# Patient Record
Sex: Male | Born: 1980 | Race: Asian | Hispanic: No | Marital: Single | Smoking: Former smoker
Health system: Southern US, Community
[De-identification: ages and names within clinical notes are randomized; demographics above are authoritative.]

## PROBLEM LIST (undated history)

## (undated) DIAGNOSIS — F32A Depression, unspecified: Secondary | ICD-10-CM

## (undated) DIAGNOSIS — F329 Major depressive disorder, single episode, unspecified: Secondary | ICD-10-CM

---

## 2010-05-11 ENCOUNTER — Emergency Department (HOSPITAL_BASED_OUTPATIENT_CLINIC_OR_DEPARTMENT_OTHER): Admission: EM | Admit: 2010-05-11 | Discharge: 2010-05-12 | Payer: Self-pay | Admitting: Emergency Medicine

## 2010-05-12 ENCOUNTER — Emergency Department (HOSPITAL_BASED_OUTPATIENT_CLINIC_OR_DEPARTMENT_OTHER): Admission: EM | Admit: 2010-05-12 | Discharge: 2010-05-12 | Payer: Self-pay | Admitting: Emergency Medicine

## 2010-12-03 LAB — URINALYSIS, ROUTINE W REFLEX MICROSCOPIC
Bilirubin Urine: NEGATIVE
Hgb urine dipstick: NEGATIVE
Nitrite: NEGATIVE
Protein, ur: 100 mg/dL — AB
Urobilinogen, UA: 0.2 mg/dL (ref 0.0–1.0)

## 2010-12-03 LAB — COMPREHENSIVE METABOLIC PANEL
CO2: 23 mEq/L (ref 19–32)
Calcium: 9.3 mg/dL (ref 8.4–10.5)
Creatinine, Ser: 1.2 mg/dL (ref 0.4–1.5)
GFR calc non Af Amer: >60 mL/min (ref >60)
Glucose, Bld: 124 mg/dL — ABNORMAL HIGH (ref 70–99)

## 2010-12-03 LAB — DIFFERENTIAL
Eosinophils Relative: 0 % (ref 0–5)
Lymphocytes Relative: 12 % (ref 12–46)
Monocytes Relative: 4 % (ref 3–12)
Neutrophils Relative %: 84 % — ABNORMAL HIGH (ref 43–77)

## 2010-12-03 LAB — CBC
HCT: 44.7 % (ref 39.0–52.0)
Hemoglobin: 15.4 g/dL (ref 13.0–17.0)
MCH: 30.1 pg (ref 26.0–34.0)
MCHC: 34.5 g/dL (ref 30.0–36.0)

## 2010-12-03 LAB — LACTIC ACID, PLASMA: Lactic Acid, Venous: 0.9 mmol/L (ref 0.5–2.2)

## 2010-12-03 LAB — HEMOCCULT GUIAC POC 1CARD (OFFICE): Fecal Occult Bld: POSITIVE

## 2010-12-03 LAB — URINE MICROSCOPIC-ADD ON

## 2013-11-24 ENCOUNTER — Encounter (HOSPITAL_BASED_OUTPATIENT_CLINIC_OR_DEPARTMENT_OTHER): Payer: Self-pay | Admitting: Emergency Medicine

## 2013-11-24 ENCOUNTER — Emergency Department (HOSPITAL_BASED_OUTPATIENT_CLINIC_OR_DEPARTMENT_OTHER)
Admission: EM | Admit: 2013-11-24 | Discharge: 2013-11-24 | Disposition: A | Discharge status: Home / Self Care | Payer: 59 | Attending: Emergency Medicine | Admitting: Emergency Medicine

## 2013-11-24 DIAGNOSIS — Z79899 Other long term (current) drug therapy: Secondary | ICD-10-CM | POA: Insufficient documentation

## 2013-11-24 DIAGNOSIS — L0231 Cutaneous abscess of buttock: Secondary | ICD-10-CM | POA: Insufficient documentation

## 2013-11-24 DIAGNOSIS — F3289 Other specified depressive episodes: Secondary | ICD-10-CM | POA: Insufficient documentation

## 2013-11-24 DIAGNOSIS — L03317 Cellulitis of buttock: Principal | ICD-10-CM

## 2013-11-24 DIAGNOSIS — F329 Major depressive disorder, single episode, unspecified: Secondary | ICD-10-CM | POA: Insufficient documentation

## 2013-11-24 DIAGNOSIS — Z87891 Personal history of nicotine dependence: Secondary | ICD-10-CM | POA: Insufficient documentation

## 2013-11-24 HISTORY — DX: Major depressive disorder, single episode, unspecified: F32.9

## 2013-11-24 HISTORY — DX: Depression, unspecified: F32.A

## 2013-11-24 MED ORDER — HYDROCODONE-ACETAMINOPHEN 5-325 MG PO TABS: 1.0000 | ORAL_TABLET | Freq: Four times a day (QID) | ORAL | Status: AC | PRN

## 2013-11-24 MED ORDER — SULFAMETHOXAZOLE-TRIMETHOPRIM 800-160 MG PO TABS: 1.0000 | ORAL_TABLET | Freq: Two times a day (BID) | ORAL | Status: AC

## 2013-11-24 NOTE — ED Notes (Signed)
Rx x 2 given for norco and septra

## 2013-11-24 NOTE — ED Notes (Signed)
Patient has swollen skin infection on R upper buttock, warm to touch/hard

## 2013-11-24 NOTE — ED Provider Notes (Signed)
Medical screening examination/treatment/procedure(s) were performed by non-physician practitioner and as supervising physician I was immediately available for consultation/collaboration.   EKG Interpretation None        Shelda JakesScott W. Xan Sparkman, MD 11/24/13 2355

## 2013-11-24 NOTE — ED Provider Notes (Signed)
CSN: [id number]     Arrival date & time 11/24/13  1823 History   First MD Initiated Contact with Patient 11/24/13 2000     Chief Complaint  Patient presents with  . Abscess     (Consider location/radiation/quality/duration/timing/severity/associated sxs/prior Treatment) Patient is a 33 y.o. male presenting with abscess. The history is provided by the patient.  Abscess Location:  Ano-genital Ano-genital abscess location:  R buttock Size:  3 cm x 3 cm Abscess quality: draining, painful, redness and warmth   Red streaking: yes   Duration:  2 days Progression:  Worsening Pain details:    Quality:  Throbbing and shooting   Severity:  Moderate   Timing:  Constant   Progression:  Worsening Chronicity:  New Context: insect bite/sting   Context: not diabetes   Relieved by:  Nothing Worsened by:  Draining/squeezing Ineffective treatments:  Draining/squeezing  Estus Krakowski is a 33 y.o. male who presents to the ED with an abscess to the right upper buttock that started 2 days ago. He tried to squeeze the area and did get some pus out but then he noted increased pain and redness around the area. He thinks it started out as an insect bite. He denies nausea, vomiting, fever, chills or any other problems. He took Advil and that helped the pain a little bit for a while.   Past Medical History  Diagnosis Date  . Depression    History reviewed. No pertinent past surgical history. No family history on file. History  Substance Use Topics  . Smoking status: Former Games developer  . Smokeless tobacco: Not on file  . Alcohol Use: Yes    Review of Systems    Allergies  Review of patient's allergies indicates no known allergies.  Home Medications   Current Outpatient Rx  Name  Route  Sig  Dispense  Refill  . ALPRAZOLAM PO   Oral   Take by mouth as needed.         . SERTRALINE HCL PO   Oral   Take by mouth.          BP 126/95  Pulse 91  Temp(Src) 98.4 F (36.9 C) (Oral)  Resp  18  SpO2 99% Physical Exam  Nursing note and vitals reviewed. Constitutional: He is oriented to person, place, and time. He appears well-developed and well-nourished. No distress.  HENT:  Head: Normocephalic.  Eyes: EOM are normal.  Neck: Neck supple.  Cardiovascular: Normal rate.   Pulmonary/Chest: Effort normal.  Musculoskeletal: Normal range of motion.       Back:  There is a 3 cm abscess that is firm and has a small area of drainage. There is erythema surrounding the the firm area. It is tender on exam.  Neurological: He is alert and oriented to person, place, and time. No cranial nerve deficit.  Skin: Skin is warm and dry.  Psychiatric: He has a normal mood and affect. His behavior is normal.    ED Course  Procedures  MDM  32 y.o. male with abscess to the upper aspect of the right buttock that has started to drain. There is erythema surrounding the abscess. Will start antibiotics and pain medication. He will apply warm wet compresses to the area several times a day and follow up with his PCP. He will return here for worsening symptoms.  Discussed with the patient and all questioned fully answered.    Medication List    TAKE these medications       HYDROcodone-acetaminophen  5-325 MG per tablet  Commonly known as:  NORCO  Take 1 tablet by mouth every 6 (six) hours as needed for moderate pain.     sulfamethoxazole-trimethoprim 800-160 MG per tablet  Commonly known as:  BACTRIM DS,SEPTRA DS  Take 1 tablet by mouth 2 (two) times daily.      ASK your doctor about these medications       ALPRAZOLAM PO  Take by mouth as needed.     SERTRALINE HCL PO  Take by mouth.         [place]aski[place] M Neese, [place] 11/24/13 2151

## 2013-11-24 NOTE — Discharge Instructions (Signed)
Continue to take Advil. Apply warm wet compresses to the area several times a day. Take the medication as directed. Do not take the narcotic if you are driving as it will make you sleepy. Follow up with your doctor in a couple days to be sure the area is improving. Return here as needed for worsening symptoms.

## 2017-12-21 ENCOUNTER — Emergency Department (HOSPITAL_BASED_OUTPATIENT_CLINIC_OR_DEPARTMENT_OTHER)
Admission: EM | Admit: 2017-12-21 | Discharge: 2017-12-21 | Disposition: A | Discharge status: Home / Self Care | Payer: Managed Care, Other (non HMO) | Attending: Emergency Medicine | Admitting: Emergency Medicine

## 2017-12-21 ENCOUNTER — Other Ambulatory Visit: Payer: Self-pay

## 2017-12-21 ENCOUNTER — Encounter (HOSPITAL_BASED_OUTPATIENT_CLINIC_OR_DEPARTMENT_OTHER): Payer: Self-pay | Admitting: *Deleted

## 2017-12-21 DIAGNOSIS — S098XXA Other specified injuries of head, initial encounter: Secondary | ICD-10-CM | POA: Diagnosis present

## 2017-12-21 DIAGNOSIS — Y9389 Activity, other specified: Secondary | ICD-10-CM | POA: Diagnosis not present

## 2017-12-21 DIAGNOSIS — Z87891 Personal history of nicotine dependence: Secondary | ICD-10-CM | POA: Diagnosis not present

## 2017-12-21 DIAGNOSIS — Y999 Unspecified external cause status: Secondary | ICD-10-CM | POA: Insufficient documentation

## 2017-12-21 DIAGNOSIS — Y929 Unspecified place or not applicable: Secondary | ICD-10-CM | POA: Diagnosis not present

## 2017-12-21 DIAGNOSIS — S0101XA Laceration without foreign body of scalp, initial encounter: Secondary | ICD-10-CM | POA: Insufficient documentation

## 2017-12-21 DIAGNOSIS — W228XXA Striking against or struck by other objects, initial encounter: Secondary | ICD-10-CM | POA: Insufficient documentation

## 2017-12-21 MED ORDER — LIDOCAINE-EPINEPHRINE (PF) 2 %-1:200000 IJ SOLN
10.0000 mL | Freq: Once | INTRAMUSCULAR | Status: AC
  Administered 2017-12-21: 10 mL
  Filled 2017-12-21: qty 10

## 2017-12-21 NOTE — Discharge Instructions (Addendum)
You were seen in the emergency department today for a laceration to your scalp. This was repaired with 4 staples. These staples will need to be removed in 1 week, this may be performed at your primary care follow up appointment next Friday, in the ER or at an urgent care. Return to the ER sooner for any new or worsening symptoms including but not limited to fever, chills, redness around the wound, discharge from the wound, passing out, numbness, weakness, change in your vision, or any other concerns.

## 2017-12-21 NOTE — ED Provider Notes (Signed)
[facility] EMERGENCY DEPARTMENT Provider Note   CSN: [id number] Arrival date & time: 12/21/17  1927     History   Chief Complaint Chief Complaint  Patient presents with  . Laceration    HPI Paul Glover is a 37 y.o. male who presents to the ED s/p head injury shortly prior to arrival with scalp laceration. Patient states he was working on his car when the hood of the car came down about 1-2 feet and hit him in the head. No LOC. States he is having minimal discomfort to the area of the laceration, states this is a 1/10 in severity. No alleviating/aggravting factors. Last tetanus was 1-2 years prior. Denies nausea, vomiting, change in vision, numbness, weakness, rhinorrhea, seizure activity, confusion, or neck pain. Patient is not on any blood thinners.   HPI  Past Medical History:  Diagnosis Date  . Depression     There are no active problems to display for this patient.   History reviewed. No pertinent surgical history.      Home Medications    Prior to Admission medications   Medication Sig Start Date End Date Taking? Authorizing Provider  SERTRALINE HCL PO Take by mouth.   Yes [provider]  ALPRAZOLAM PO Take by mouth as needed.    [provider]  HYDROcodone-acetaminophen (NORCO) 5-325 MG per tablet Take 1 tablet by mouth every 6 (six) hours as needed for moderate pain. 11/24/13   Janne NapoleonNeese, Hope M, NP    Family History History reviewed. No pertinent family history.  Social History Social History   Tobacco Use  . Smoking status: Former Games developermoker  . Smokeless tobacco: Never Used  Substance Use Topics  . Alcohol use: Yes  . Drug use: No     Allergies   Patient has no known allergies.   Review of Systems Review of Systems  Eyes: Negative for visual disturbance.  Musculoskeletal: Negative for neck pain.  Skin: Positive for wound.  Neurological: Negative for dizziness, seizures, syncope, weakness, light-headedness and numbness.    Psychiatric/Behavioral: Negative for confusion.   Physical Exam Updated Vital Signs BP (!) 134/106 (BP Location: Left Arm)   Pulse 83   Temp 98.2 F (36.8 C) (Oral)   Resp 20   Ht 5\' 9"  (1.753 m)   Wt 72.6 kg (160 lb)   SpO2 99%   BMI 23.63 kg/m   Physical Exam  Constitutional: He appears well-developed and well-nourished.  Non-toxic appearance. No distress.  HENT:  Head: Normocephalic. Head is with laceration (2.5 cm linear frontal scalp laceration. No foreign body or active bleeding.  There is an additional 0.5 very superficial laceration just posterior to the larger one on the scalp. ). Head is without raccoon's eyes and without Battle's sign.  Right Ear: No hemotympanum.  Left Ear: No hemotympanum.  Nose: No rhinorrhea.  Eyes: Pupils are equal, round, and reactive to light. Conjunctivae and EOM are normal. Right eye exhibits no discharge. Left eye exhibits no discharge.  Neck: Normal range of motion. Neck supple. No spinous process tenderness present.  Cardiovascular: Normal rate and regular rhythm.  No murmur heard. Pulmonary/Chest: Breath sounds normal. No respiratory distress. He has no wheezes. He has no rales.  Neurological: He is alert.  Clear speech. CN III-XII grossly intact. Sensation grossly intact to bilateral upper/lower extremities. 5/5 grip strength. 5/5 strength with plantar/dorsiflexion. Steady gait.   Skin: Skin is warm and dry. No rash noted.  Psychiatric: He has a normal mood and affect. His  behavior is normal.  Nursing note and vitals reviewed.    ED Treatments / Results  Labs (all labs ordered are listed, but only abnormal results are displayed) Labs Reviewed - No data to display  EKG None  Radiology No results found.  Procedures .Marland KitchenLaceration Repair Date/Time: 12/21/2017 8:41 PM Performed by: Cherly Anderson, PA-C Authorized by: Cherly Anderson, PA-C   Consent:    Consent obtained:  Verbal   Consent given by:  Patient    Risks discussed:  Infection, pain, poor cosmetic result and poor wound healing   Alternatives discussed:  No treatment Anesthesia (see MAR for exact dosages):    Anesthesia method:  Local infiltration   Local anesthetic:  Lidocaine 2% WITH epi Laceration details:    Location:  Scalp   Scalp location:  Frontal   Length (cm):  2.5   Depth (mm):  3 Exploration:    Hemostasis achieved with:  Direct pressure and epinephrine   Wound exploration: wound explored through full range of motion and entire depth of wound probed and visualized     Contaminated: no   Treatment:    Area cleansed with:  Betadine   Amount of cleaning:  Standard   Irrigation solution:  Sterile saline   Irrigation volume:  500cc   Irrigation method:  Pressure wash Skin repair:    Repair method:  Staples   Number of staples:  4 Approximation:    Approximation:  Close Post-procedure details:    Dressing:  Antibiotic ointment   Patient tolerance of procedure:  Tolerated well, no immediate complications   (including critical care time)  Medications Ordered in ED Medications  lidocaine-EPINEPHrine (XYLOCAINE W/EPI) 2 %-1:200000 (PF) injection 10 mL (10 mLs Infiltration Given by Other 12/21/17 1956)    Initial Impression / Assessment and Plan / ED Course  I have reviewed the triage vital signs and the nursing notes.  Pertinent labs & imaging results that were available during my care of the patient were reviewed by me and considered in my medical decision making (see chart for details).   Patient presents with head injury and subsequent scalp laceration. [place] head CT rule suggests no further imaging required. No focal neurologic deficits. No midline C-spine tenderness. Laceration repair with staples per procedure note above. Tdap is up to date. Patient is without comorbidities to effect normal wound healing. Patient discharged without antibiotics. I discussed need for wound recheck and staple removal in 7-8 days (  patient had PCP annual physical scheduled for next Friday), and return precautions with the patient. Provided opportunity for questions, patient confirmed understanding and is in agreement with plan.   Final Clinical Impressions(s) / ED Diagnoses   Final diagnoses:  Laceration of scalp, initial encounter    ED Discharge Orders    None       Cherly Anderson, PA-C 12/21/17 2341    Gwyneth Sprout, MD 12/23/17 0830

## 2017-12-21 NOTE — ED Triage Notes (Signed)
Scalp laceration. He was working on his car and the hood fell and hit him in the top of his head. Bleeding controlled.

## 2020-06-30 ENCOUNTER — Emergency Department (HOSPITAL_COMMUNITY)
Admission: EM | Admit: 2020-06-30 | Discharge: 2020-06-30 | Disposition: A | Discharge status: Home / Self Care | Payer: Managed Care, Other (non HMO) | Attending: Emergency Medicine | Admitting: Emergency Medicine

## 2020-06-30 ENCOUNTER — Emergency Department (HOSPITAL_COMMUNITY): Payer: Managed Care, Other (non HMO)

## 2020-06-30 ENCOUNTER — Encounter (HOSPITAL_COMMUNITY): Payer: Self-pay | Admitting: Emergency Medicine

## 2020-06-30 DIAGNOSIS — R079 Chest pain, unspecified: Secondary | ICD-10-CM | POA: Insufficient documentation

## 2020-06-30 DIAGNOSIS — Z5321 Procedure and treatment not carried out due to patient leaving prior to being seen by health care provider: Secondary | ICD-10-CM | POA: Insufficient documentation

## 2020-06-30 NOTE — ED Triage Notes (Signed)
Pt arriving POV with left side chest pain x2 weeks. Pt reports recently being put on Lipitor for high cholesterol but says he had a bad reaction to it and has been discussing alternatives with his primary provider.

## 2020-06-30 NOTE — ED Notes (Signed)
Unable to recheck patient vital signs he left and gave his stickers to the screeners

## 2020-11-17 ENCOUNTER — Emergency Department (HOSPITAL_COMMUNITY)
Admission: EM | Admit: 2020-11-17 | Discharge: 2020-11-17 | Disposition: A | Discharge status: Home / Self Care | Payer: No Typology Code available for payment source | Attending: Emergency Medicine | Admitting: Emergency Medicine

## 2020-11-17 ENCOUNTER — Encounter (HOSPITAL_COMMUNITY): Payer: Self-pay | Admitting: Emergency Medicine

## 2020-11-17 ENCOUNTER — Emergency Department (HOSPITAL_COMMUNITY): Payer: No Typology Code available for payment source

## 2020-11-17 DIAGNOSIS — Z87891 Personal history of nicotine dependence: Secondary | ICD-10-CM | POA: Diagnosis not present

## 2020-11-17 DIAGNOSIS — S9032XA Contusion of left foot, initial encounter: Secondary | ICD-10-CM

## 2020-11-17 DIAGNOSIS — S91312A Laceration without foreign body, left foot, initial encounter: Secondary | ICD-10-CM | POA: Diagnosis not present

## 2020-11-17 DIAGNOSIS — T1490XA Injury, unspecified, initial encounter: Secondary | ICD-10-CM

## 2020-11-17 DIAGNOSIS — W230XXA Caught, crushed, jammed, or pinched between moving objects, initial encounter: Secondary | ICD-10-CM | POA: Diagnosis not present

## 2020-11-17 DIAGNOSIS — S99922A Unspecified injury of left foot, initial encounter: Secondary | ICD-10-CM | POA: Diagnosis present

## 2020-11-17 DIAGNOSIS — Y990 Civilian activity done for income or pay: Secondary | ICD-10-CM | POA: Insufficient documentation

## 2020-11-17 MED ORDER — LIDOCAINE-EPINEPHRINE (PF) 2 %-1:200000 IJ SOLN
10.0000 mL | Freq: Once | INTRAMUSCULAR | Status: AC
  Administered 2020-11-17: 10 mL
  Filled 2020-11-17: qty 20

## 2020-11-17 MED ORDER — ACETAMINOPHEN 500 MG PO TABS
1000.0000 mg | ORAL_TABLET | Freq: Once | ORAL | Status: AC
  Administered 2020-11-17: 1000 mg via ORAL
  Filled 2020-11-17: qty 2

## 2020-11-17 MED ORDER — IBUPROFEN 200 MG PO TABS
400.0000 mg | ORAL_TABLET | Freq: Once | ORAL | Status: AC
  Administered 2020-11-17: 400 mg via ORAL
  Filled 2020-11-17: qty 2

## 2020-11-17 NOTE — ED Provider Notes (Signed)
[name] Provider Note   CSN: [id number] Arrival date & time: 11/17/20  1506     History Chief Complaint  Patient presents with  . Foot Pain    Paul Glover is a 40 y.o. male.  Patient is a healthy 40 year old male presenting today after a foot injury at work.  His foot was pinned between a forklift and a hydraulic pallet.  It was pinned for approximately a few seconds before the pressure was released.  He had laceration to the foot and significant pain.  He can feel his toes and move his toes however he cannot bear weight due to the pain.  He denies injury anywhere else.  He has no ankle pain or knee pain.  His last tetanus shot was a few years ago.  He takes no anticoagulation and has no known allergies.  Currently the pain is bearable but worse with any movement or palpation of the area of the left foot.  The history is provided by the patient.  Foot Pain This is a new problem. The current episode started 1 to 2 hours ago. The problem occurs constantly. The problem has not changed since onset.      Past Medical History:  Diagnosis Date  . Depression     There are no problems to display for this patient.   History reviewed. No pertinent surgical history.     No family history on file.  Social History   Tobacco Use  . Smoking status: Former Games developer  . Smokeless tobacco: Never Used  Substance Use Topics  . Alcohol use: Yes  . Drug use: No    Home Medications Prior to Admission medications   Medication Sig Start Date End Date Taking? Authorizing Provider  ALPRAZOLAM PO Take by mouth as needed.    [provider]  HYDROcodone-acetaminophen (NORCO) 5-325 MG per tablet Take 1 tablet by mouth every 6 (six) hours as needed for moderate pain. 11/24/13   Janne Napoleon, NP  SERTRALINE HCL PO Take by mouth.    [provider]    Allergies    Patient has no known allergies.  Review of Systems   Review of Systems   All other systems reviewed and are negative.   Physical Exam Updated Vital Signs BP 137/80 (BP Location: Left Arm)   Pulse 95   Temp 98.2 F (36.8 C)   Resp 16   SpO2 97%   Physical Exam Vitals and nursing note reviewed.  Constitutional:      General: He is not in acute distress.    Appearance: Normal appearance. He is normal weight.  HENT:     Head: Normocephalic.  Eyes:     Pupils: Pupils are equal, round, and reactive to light.  Cardiovascular:     Rate and Rhythm: Normal rate.     Pulses: Normal pulses.  Pulmonary:     Effort: Pulmonary effort is normal.  Musculoskeletal:        General: Swelling, tenderness and signs of injury present.     Right ankle: Normal.     Left ankle: Normal.     Right foot: Normal.       Feet:     Comments: Diffuse left foot swelling and ecchymosis.  Sensation intact in all 5 toes and able to move all 5 toes.  Full range of motion at the ankle without discomfort.  No left knee or tib-fib pain.  Skin:    General: Skin is warm and  dry.  Neurological:     General: No focal deficit present.     Mental Status: He is alert and oriented to person, place, and time. Mental status is at baseline.     ED Results / Procedures / Treatments   Labs (all labs ordered are listed, but only abnormal results are displayed) Labs Reviewed - No data to display  EKG None  Radiology DG Foot Complete Left  Result Date: 11/17/2020 CLINICAL DATA:  Foot pain crush injury EXAM: LEFT FOOT - COMPLETE 3+ VIEW COMPARISON:  None. FINDINGS: No fracture or malalignment. Dorsal soft tissue swelling. No radiopaque foreign body. Probable bone island in the calcaneus IMPRESSION: No acute osseous abnormality. Electronically Signed   By: Jasmine Pang M.D.   On: 11/17/2020 15:57    Procedures Procedures   LACERATION REPAIR Performed by: [facility] Authorized by: Gwyneth Sprout Consent: Verbal consent obtained. Risks and benefits: risks, benefits and  alternatives were discussed Consent given by: patient Patient identity confirmed: provided demographic data Prepped and Draped in normal sterile fashion Wound explored  Laceration Location: dorsal left foot  Laceration Length: 3cm  No Foreign Bodies seen or palpated  Anesthesia: local infiltration  Local anesthetic: lidocaine 2% with epinephrine  Anesthetic total: 3 ml  Irrigation method: syringe Amount of cleaning: standard  Skin closure: 4.0 vicryl rapide  Number of sutures: 8  Technique: simple interrupted  Patient tolerance: Patient tolerated the procedure well with no immediate complications. LACERATION REPAIR Performed by: [facility] Authorized by: Gwyneth Sprout Consent: Verbal consent obtained. Risks and benefits: risks, benefits and alternatives were discussed Consent given by: patient Patient identity confirmed: provided demographic data Prepped and Draped in normal sterile fashion Wound explored  Laceration Location: between the 1st and 2nd left toes  Laceration Length: 2cm  No Foreign Bodies seen or palpated  Anesthesia: local infiltration  Local anesthetic: lidocaine 2% with epinephrine  Anesthetic total: 4 ml  Irrigation method: syringe Amount of cleaning: standard  Skin closure: 4.0 vicryl rapide  Number of sutures: 6  Technique: simple interrupted  Patient tolerance: Patient tolerated the procedure well with no immediate complications.  Medications Ordered in ED Medications  ibuprofen (ADVIL) tablet 400 mg (has no administration in time range)  acetaminophen (TYLENOL) tablet 1,000 mg (has no administration in time range)    ED Course  I have reviewed the triage vital signs and the nursing notes.  Pertinent labs & imaging results that were available during my care of the patient were reviewed by me and considered in my medical decision making (see chart for details).    MDM Rules/Calculators/A&P                           Patient presenting after a work-related injury to the left foot.  Patient does have 2 lacerations and extensive ecchymosis and swelling.  Potentially all crush injury however need to rule out open fracture.  Neurovascularly intact at this time.  Tetanus shot is up-to-date.  5:21 PM X-ray wnl.  Wounds repaired as above.  Will d/c home with ortho f/u.  Weight bearing as tolerated.  MDM Number of Diagnoses or Management Options   Amount and/or Complexity of Data Reviewed Tests in the radiology section of CPT: ordered and reviewed Independent visualization of images, tracings, or specimens: yes     Final Clinical Impression(s) / ED Diagnoses Final diagnoses:  Injury  Contusion of left foot, initial encounter  Foot laceration, left, initial encounter  Rx / DC Orders ED Discharge Orders    None       Gwyneth Sprout, MD 11/17/20 1721

## 2020-11-17 NOTE — Discharge Instructions (Signed)
Elevated the foot to help with swelling and apply ice.  Sutures are dissolvable and should be able to be pulled out in about 7-9 days.  Avoid soaking the foot or scrubbing it.  Don't change the dressing till tomorrow.

## 2020-11-17 NOTE — ED Notes (Signed)
Wound care performed to left foot lacerations.

## 2020-11-17 NOTE — Progress Notes (Signed)
Orthopedic Tech Progress Note Patient Details:  Paul Glover September 05, 1981 [id number]  Ortho Devices Ortho Device/Splint Location: crutches Ortho Device/Splint Interventions: Ordered   Post Interventions Patient Tolerated: Well Instructions Provided: Care of device   Jennye Moccasin 11/17/2020, 5:20 PM

## 2020-11-17 NOTE — ED Triage Notes (Signed)
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

## 2022-01-04 IMAGING — DX DG FOOT COMPLETE 3+V*L*
3 series · 3 of 3 positions shown · non-contrast
Comparison: None.

CLINICAL DATA: Foot pain crush injury

EXAM:
LEFT FOOT - COMPLETE 3+ VIEW

[foot ap]
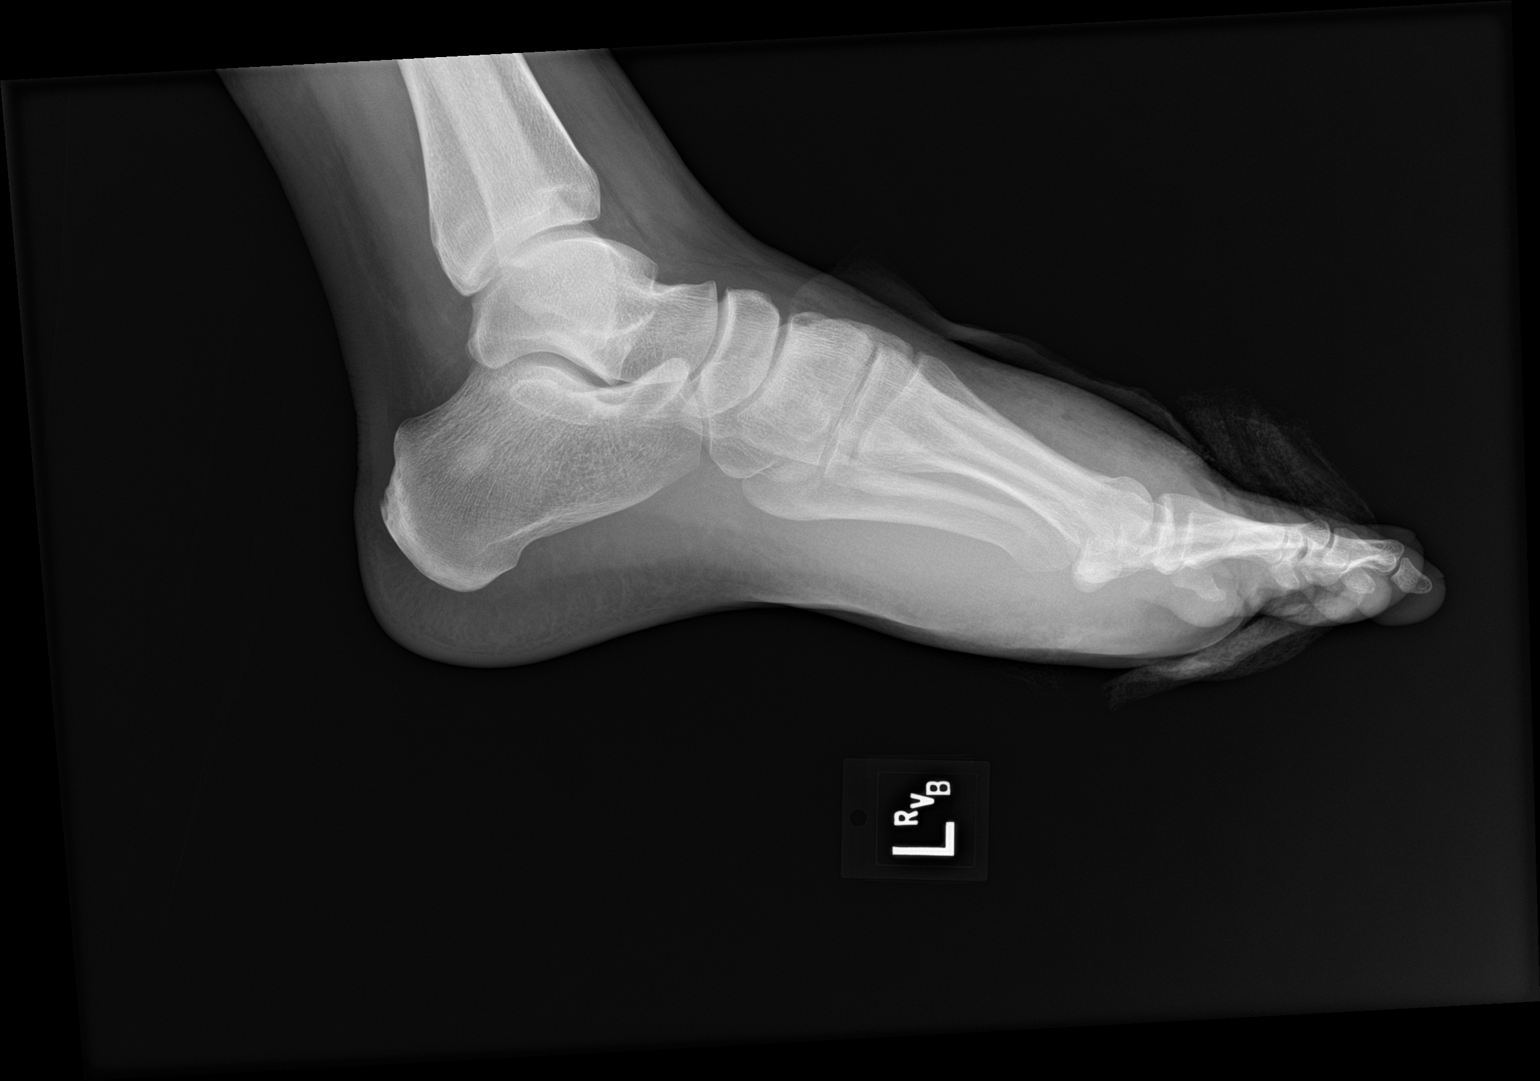

[foot obl]
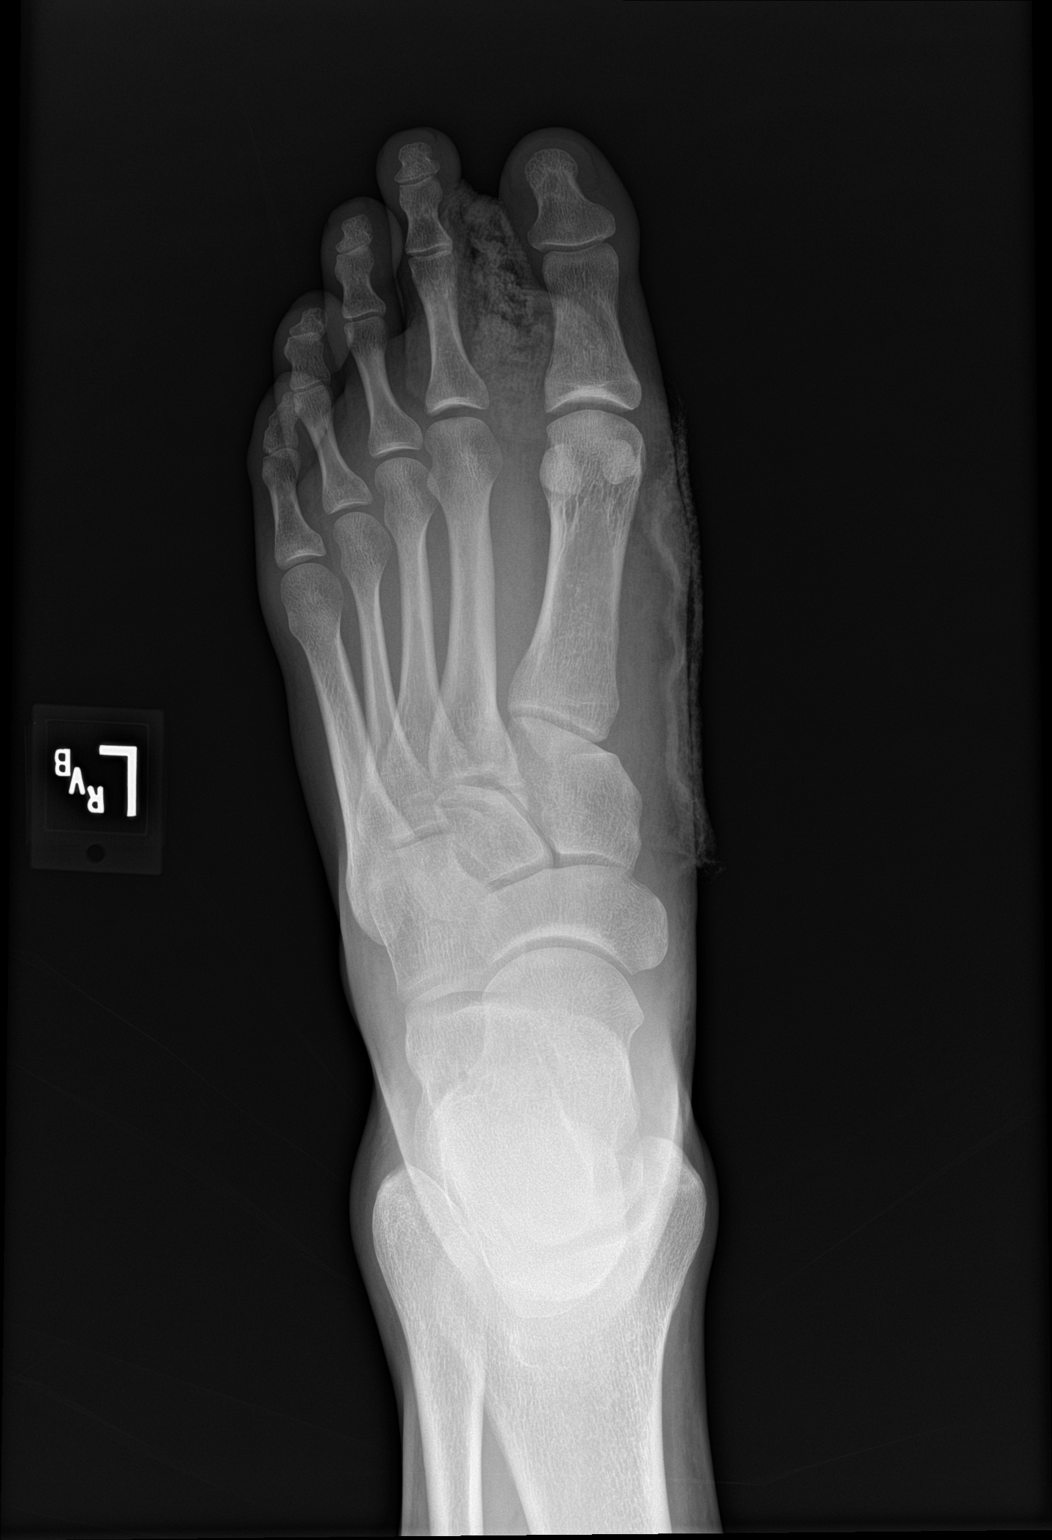

[foot lat]
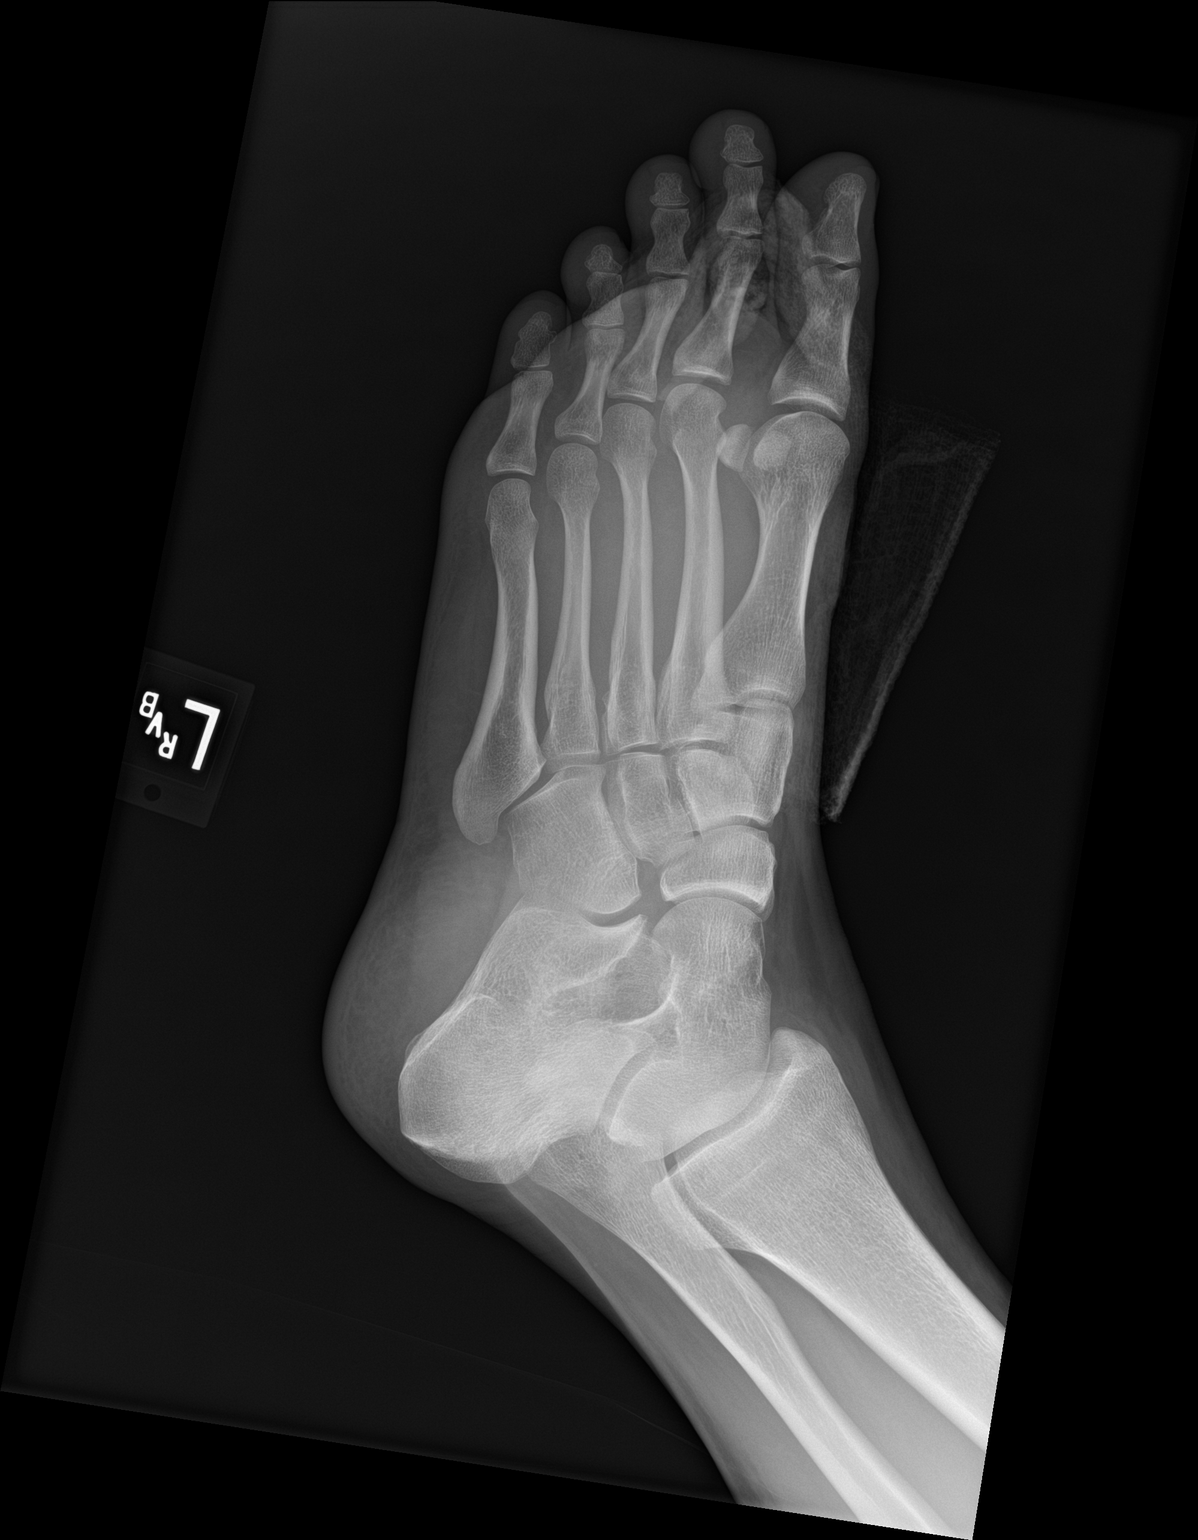

[3 of 3 positions shown; findings below may reference images not displayed]

FINDINGS: No fracture or malalignment. Dorsal soft tissue swelling. No
radiopaque foreign body. Probable bone island in the calcaneus
IMPRESSION: No acute osseous abnormality.
